# Patient Record
Sex: Female | Born: 2000 | Race: Black or African American | Hispanic: No | Marital: Single | Smoking: Never smoker
Health system: Southern US, Community
[De-identification: ages and names within clinical notes are randomized; demographics above are authoritative.]

## PROBLEM LIST (undated history)

## (undated) DIAGNOSIS — E039 Hypothyroidism, unspecified: Secondary | ICD-10-CM

---
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
## 2020-07-04 ENCOUNTER — Ambulatory Visit
Admission: EM | Admit: 2020-07-04 | Discharge: 2020-07-04 | Disposition: A | Discharge status: Home / Self Care | Payer: Medicaid Other | Attending: Nurse Practitioner | Admitting: Nurse Practitioner

## 2020-07-04 ENCOUNTER — Other Ambulatory Visit: Payer: Self-pay

## 2020-07-04 DIAGNOSIS — N760 Acute vaginitis: Secondary | ICD-10-CM | POA: Diagnosis present

## 2020-07-04 DIAGNOSIS — B9689 Other specified bacterial agents as the cause of diseases classified elsewhere: Secondary | ICD-10-CM

## 2020-07-04 DIAGNOSIS — Z3490 Encounter for supervision of normal pregnancy, unspecified, unspecified trimester: Secondary | ICD-10-CM | POA: Diagnosis not present

## 2020-07-04 LAB — URINALYSIS, COMPLETE (UACMP) WITH MICROSCOPIC
Bilirubin Urine: NEGATIVE
Glucose, UA: NEGATIVE mg/dL
Hgb urine dipstick: NEGATIVE
Leukocytes,Ua: NEGATIVE
Nitrite: NEGATIVE
Protein, ur: NEGATIVE mg/dL
RBC / HPF: NONE SEEN RBC/hpf (ref 0–5)
Specific Gravity, Urine: 1.02 (ref 1.005–1.030)
WBC, UA: NONE SEEN WBC/hpf (ref 0–5)
pH: 6 (ref 5.0–8.0)

## 2020-07-04 LAB — HCG, QUANTITATIVE, PREGNANCY: hCG, Beta Chain, Quant, S: 32411 m[IU]/mL — ABNORMAL HIGH (ref <5)

## 2020-07-04 LAB — PREGNANCY, URINE: Preg Test, Ur: POSITIVE — AB

## 2020-07-04 LAB — CHLAMYDIA/NGC RT PCR (ARMC ONLY)
Chlamydia Tr: DETECTED — AB
N gonorrhoeae: NOT DETECTED

## 2020-07-04 LAB — WET PREP, GENITAL
Sperm: NONE SEEN
Trich, Wet Prep: NONE SEEN
WBC, Wet Prep HPF POC: NONE SEEN
Yeast Wet Prep HPF POC: NONE SEEN

## 2020-07-04 MED ORDER — PROMETHAZINE HCL 12.5 MG PO TABS
12.5000 mg | ORAL_TABLET | Freq: Four times a day (QID) | ORAL | 0 refills | Status: DC | PRN
Start: 2020-07-04 — End: 2020-10-12

## 2020-07-04 MED ORDER — PRENATAL COMPLETE 14-0.4 MG PO TABS: 1.0000 | ORAL_TABLET | Freq: Every day | ORAL | 0 refills | Status: AC

## 2020-07-04 MED ORDER — METRONIDAZOLE 500 MG PO TABS
500.0000 mg | ORAL_TABLET | Freq: Two times a day (BID) | ORAL | 0 refills | Status: DC
Start: 2020-07-04 — End: 2020-10-12

## 2020-07-04 NOTE — ED Triage Notes (Addendum)
Patient complains of breast tenderness, nausea upon awakening, vomiting x 2 weeks. Patient states that she has taken 2 pregnancy tests and they both were positive today.   Patient also states that she is having a milky white discharge for a while and would like to be tested.   States that she did have her first Moderna Covid Vaccine yesterday.

## 2020-07-04 NOTE — ED Provider Notes (Signed)
[facility]    CSN: [id number] Arrival date & time: 07/04/20  1443      History   Chief Complaint Chief Complaint  Patient presents with  . Vaginal Discharge    HPI Jillian Tucker is a 19 y.o. female.   Subjective:  Jillian Tucker is a 19 y.o. female (G0P0) who presents for evaluation of amenorrhea. She believes she could be pregnant. Patient is ambivalent about pregnancy. She is sexually active. She uses Depo-Provera injections for contraceptive. She started on Depo in October 2020. She had her last injection in January 2021. She hasn't had another Depo injection since. LMP unknown. Current symptoms also include: breast tenderness, morning sickness, nausea and two positive home pregnancy test.  Miss Poynor also requests evaluation of vaginal discharge. Symptoms have been present for 1 week. Symptoms include local irritation and white vaginal discharge.   STI Risk: Possible STD exposure   The following portions of the patient's history were reviewed and updated as appropriate: allergies, current medications, past family history, past medical history, past social history, past surgical history and problem list.         History reviewed. No pertinent past medical history.  There are no problems to display for this patient.   History reviewed. No pertinent surgical history.  OB History    Gravida  1   Para      Term      Preterm      AB      Living        SAB      TAB      Ectopic      Multiple      Live Births               Home Medications    Prior to Admission medications   Medication Sig Start Date End Date Taking? Authorizing Provider  metroNIDAZOLE (FLAGYL) 500 MG tablet Take 1 tablet (500 mg total) by mouth 2 (two) times daily. 07/04/20   Lurline Idol, FNP  Prenatal Vit-Fe Fumarate-FA (PRENATAL COMPLETE) 14-0.4 MG TABS Take 1 tablet by mouth daily with breakfast. 07/04/20   Lurline Idol, FNP  promethazine (PHENERGAN) 12.5  MG tablet Take 1 tablet (12.5 mg total) by mouth every 6 (six) hours as needed for nausea or vomiting. 07/04/20 08/03/20  Lurline Idol, FNP    Family History Family History  Problem Relation Age of Onset  . Healthy Mother   . Healthy Father   . Healthy Sister     Social History Social History   Tobacco Use  . Smoking status: Never Smoker  . Smokeless tobacco: Never Used  Vaping Use  . Vaping Use: Never used  Substance Use Topics  . Alcohol use: Never  . Drug use: Never     Allergies   Patient has no known allergies.   Review of Systems Review of Systems  Constitutional: Negative.   Gastrointestinal: Positive for nausea and vomiting.  Genitourinary: Positive for vaginal discharge. Negative for dysuria.  Neurological: Negative.   All other systems reviewed and are negative.    Physical Exam Triage Vital Signs ED Triage Vitals  Enc Vitals Group     BP 07/04/20 1521 119/83     Pulse Rate 07/04/20 1521 85     Resp 07/04/20 1521 18     Temp 07/04/20 1521 98.6 F (37 C)     Temp Source 07/04/20 1521 Oral     SpO2 07/04/20 1521 100 %  Weight 07/04/20 1514 188 lb (85.3 kg)     Height 07/04/20 1514 5\' 6"  (1.676 m)     Head Circumference --      Peak Flow --      Pain Score 07/04/20 1514 0     Pain Loc --      Pain Edu? --      Excl. in GC? --    No data found.  Updated Vital Signs BP 119/83 (BP Location: Left Arm)   Pulse 85   Temp 98.6 F (37 C) (Oral)   Resp 18   Ht 5\' 6"  (1.676 m)   Wt 188 lb (85.3 kg)   LMP  (LMP Unknown) Comment: Depo Shot in 12/2019, cycle in either April or May  SpO2 100%   BMI 30.34 kg/m   Visual Acuity Right Eye Distance:   Left Eye Distance:   Bilateral Distance:    Right Eye Near:   Left Eye Near:    Bilateral Near:     Physical Exam Vitals reviewed.  Constitutional:      Appearance: Normal appearance.  HENT:     Head: Normocephalic.  Cardiovascular:     Rate and Rhythm: Normal rate and regular rhythm.    Pulmonary:     Effort: Pulmonary effort is normal.     Breath sounds: Normal breath sounds.  Abdominal:     Palpations: Abdomen is soft.  Musculoskeletal:        General: Normal range of motion.     Cervical back: Normal range of motion and neck supple.  Skin:    General: Skin is warm and dry.  Neurological:     General: No focal deficit present.     Mental Status: She is alert and oriented to person, place, and time.  Psychiatric:        Mood and Affect: Mood normal.      UC Treatments / Results  Labs (all labs ordered are listed, but only abnormal results are displayed) Labs Reviewed  WET PREP, GENITAL - Abnormal; Notable for the following components:      Result Value   Clue Cells Wet Prep HPF POC PRESENT (*)    All other components within normal limits  PREGNANCY, URINE - Abnormal; Notable for the following components:   Preg Test, Ur POSITIVE (*)    All other components within normal limits  URINALYSIS, COMPLETE (UACMP) WITH MICROSCOPIC - Abnormal; Notable for the following components:   APPearance HAZY (*)    Ketones, ur TRACE (*)    Bacteria, UA FEW (*)    All other components within normal limits  CHLAMYDIA/NGC RT PCR ([facility] ONLY)  HCG, QUANTITATIVE, PREGNANCY    EKG   Radiology No results found.  Procedures Procedures (including critical care time)  Medications Ordered in UC Medications - No data to display  Initial Impression / Assessment and Plan / UC Course  I have reviewed the triage vital signs and the nursing notes.  Pertinent labs & imaging results that were available during my care of the patient were reviewed by me and considered in my medical decision making (see chart for details).    19 yo female (G0P0) with vaginal discharge, breast tenderness, morning sickness, nausea and two positive home pregnancy test. UA negative. Wet preg positive for BV. Pregnancy Test: Positive: EDC: unknown. Briefly discussed pre-natal care options.  Pregnancy, Childbirth and the Newborn book given. Encouraged well-balanced diet, plenty of rest when needed, pre-natal vitamins daily and walking for exercise.  Discussed self-help for nausea, avoiding OTC medications until consulting provider or pharmacist, other than Tylenol as needed, minimal caffeine (1-2 cups daily) and avoiding alcohol. She will schedule her initial OB visit in the next month with an OB provider.   Today's evaluation has revealed no signs of a dangerous process. Discussed diagnosis with patient and/or guardian. Patient and/or guardian aware of their diagnosis, possible red flag symptoms to watch out for and need for close follow up. Patient and/or guardian understands verbal and written discharge instructions. Patient and/or guardian comfortable with plan and disposition.  Patient and/or guardian has a clear mental status at this time, good insight into illness (after discussion and teaching) and has clear judgment to make decisions regarding their care  This care was provided during an unprecedented National Emergency due to the Novel Coronavirus (COVID-19) pandemic. COVID-19 infections and transmission risks place heavy strains on healthcare resources.  As this pandemic evolves, our facility, providers, and staff strive to respond fluidly, to remain operational, and to provide care relative to available resources and information. Outcomes are unpredictable and treatments are without well-defined guidelines. Further, the impact of COVID-19 on all aspects of urgent care, including the impact to patients seeking care for reasons other than COVID-19, is unavoidable during this national emergency. At this time of the global pandemic, management of patients has significantly changed, even for non-COVID positive patients given high local and regional COVID volumes at this time requiring high healthcare system and resource utilization. The standard of care for management of both COVID suspected  and non-COVID suspected patients continues to change rapidly at the local, regional, national, and global levels. This patient was worked up and treated to the best available but ever changing evidence and resources available at this current time.   Documentation was completed with the aid of voice recognition software. Transcription may contain typographical errors.  Final Clinical Impressions(s) / UC Diagnoses   Final diagnoses:  BV (bacterial vaginosis)  Pregnancy, unspecified gestational age     Discharge Instructions     You will need to follow-up with an OB-GYN within the next month. I provided you with two different offices that offer good prenatal care. Call as soon as possible to make an appointment.     ED Prescriptions    Medication Sig Dispense Auth. Provider   metroNIDAZOLE (FLAGYL) 500 MG tablet Take 1 tablet (500 mg total) by mouth 2 (two) times daily. 14 tablet Lurline Idol, FNP   promethazine (PHENERGAN) 12.5 MG tablet Take 1 tablet (12.5 mg total) by mouth every 6 (six) hours as needed for nausea or vomiting. 30 tablet Lurline Idol, FNP   Prenatal Vit-Fe Fumarate-FA (PRENATAL COMPLETE) 14-0.4 MG TABS Take 1 tablet by mouth daily with breakfast. 60 tablet Lurline Idol, FNP     PDMP not reviewed this encounter.   Lurline Idol, [place] 07/04/20 1606

## 2020-07-04 NOTE — Discharge Instructions (Signed)
You will need to follow-up with an OB-GYN within the next month. I provided you with two different offices that offer good prenatal care. Call as soon as possible to make an appointment.

## 2020-07-06 ENCOUNTER — Telehealth (HOSPITAL_COMMUNITY): Payer: Self-pay

## 2020-07-06 MED ORDER — AZITHROMYCIN 250 MG PO TABS: ORAL_TABLET | ORAL | 0 refills | Status: DC

## 2020-10-12 ENCOUNTER — Encounter (HOSPITAL_COMMUNITY): Payer: Self-pay | Admitting: Obstetrics and Gynecology

## 2020-10-12 ENCOUNTER — Inpatient Hospital Stay (HOSPITAL_BASED_OUTPATIENT_CLINIC_OR_DEPARTMENT_OTHER)
Admit: 2020-10-12 | Discharge: 2020-10-12 | Disposition: A | Discharge status: Home / Self Care | Payer: Medicaid Other | Attending: Student | Admitting: Student

## 2020-10-12 ENCOUNTER — Other Ambulatory Visit: Payer: Self-pay

## 2020-10-12 ENCOUNTER — Inpatient Hospital Stay (HOSPITAL_COMMUNITY)
Admission: AD | Admit: 2020-10-12 | Discharge: 2020-10-12 | Disposition: A | Discharge status: Home / Self Care | Payer: Medicaid Other | Attending: Obstetrics and Gynecology | Admitting: Obstetrics and Gynecology

## 2020-10-12 DIAGNOSIS — M7989 Other specified soft tissue disorders: Secondary | ICD-10-CM | POA: Insufficient documentation

## 2020-10-12 DIAGNOSIS — Z3A20 20 weeks gestation of pregnancy: Secondary | ICD-10-CM | POA: Diagnosis not present

## 2020-10-12 DIAGNOSIS — O1202 Gestational edema, second trimester: Secondary | ICD-10-CM | POA: Insufficient documentation

## 2020-10-12 DIAGNOSIS — O26892 Other specified pregnancy related conditions, second trimester: Secondary | ICD-10-CM | POA: Diagnosis not present

## 2020-10-12 HISTORY — DX: Hypothyroidism, unspecified: E03.9

## 2020-10-12 NOTE — MAU Note (Signed)
.   Jillian Tucker is a 19 y.o. at [redacted]w[redacted]d here in MAU reporting: she has had swelling in her left leg and foot for three days. She gets her [facility] in [place] and called them this morning and the CNM told her to get compression socks. She decided to come here and be evaluated. Denies any VB or abdominal pain  Onset of complaint:  3 days Pain score: 8 Vitals:   10/12/20 1359  BP: 131/70  Pulse: 85  Resp: 16  Temp: 98.2 F (36.8 C)  SpO2: 100%     FHT:156 Lab orders placed from triage: UA

## 2020-10-12 NOTE — Discharge Instructions (Signed)

## 2020-10-12 NOTE — MAU Provider Note (Signed)
History     CSN: [id number]  Arrival date and time: 10/12/20 1331   First Provider Initiated Contact with Patient 10/12/20 1427      Chief Complaint  Patient presents with  . left leg swelling   Jillian Tucker is a 19 y.o. G1P0000 at [redacted]w[redacted]d who presents with left leg/foot swelling. Symptoms started earlier this week. Reports increased swelling primarily in her left ankle with associated tenderness. Denies prolonged travel, cough, or SOB. No hx VTE. No injury.  Goes to [facility] for prenatal care.  Denies any other OB complaints.    OB History    Gravida  1   Para  0   Term  0   Preterm  0   AB  0   Living  0     SAB  0   TAB  0   Ectopic  0   Multiple  0   Live Births  0           Past Medical History:  Diagnosis Date  . Hypothyroidism    low, but stable. no meds    History reviewed. No pertinent surgical history.  Family History  Problem Relation Age of Onset  . Healthy Mother   . Healthy Father   . Healthy Sister     Social History   Tobacco Use  . Smoking status: Never Smoker  . Smokeless tobacco: Never Used  Vaping Use  . Vaping Use: Never used  Substance Use Topics  . Alcohol use: Never  . Drug use: Not Currently    Types: Marijuana    Comment: last used May 2021    Allergies: No Known Allergies  No medications prior to admission.    Review of Systems  Constitutional: Negative.   Respiratory: Negative for cough and shortness of breath.   Cardiovascular: Positive for leg swelling. Negative for chest pain.  Gastrointestinal: Negative.    Physical Exam   Blood pressure 119/68, pulse 82, temperature 98.2 F (36.8 C), resp. rate 16, height 5\' 3"  (1.6 m), weight 82.6 kg, SpO2 100 %.  Physical Exam Vitals and nursing note reviewed.  Constitutional:      General: She is not in acute distress.    Appearance: Normal appearance.  HENT:     Head: Normocephalic and atraumatic.  Cardiovascular:     Pulses:          Dorsalis pedis  pulses are 2+ on the right side and 2+ on the left side.     Comments: Left & right calf equal at 37 cm Pulmonary:     Effort: Pulmonary effort is normal. No respiratory distress.  Musculoskeletal:     Right lower leg: No edema.     Left lower leg: 1+ Edema (non pitting) present.  Neurological:     Mental Status: She is alert.  Psychiatric:        Mood and Affect: Mood normal.        Behavior: Behavior normal.     MAU Course  Procedures No results found for this or any previous visit (from the past 24 hour(s)).  MDM FHT present via doppler  Calves equal bilaterally. Mild swelling of left ankle & foot. Venous doppler study ordered and no evidence of DVT.   Assessment and Plan   1. Swelling of lower extremity during pregnancy in second trimester  -No evidence of DVT. Reviewed reasons to return to MAU -knee high compression socks, elevation, no prolonged positions  2. [redacted] weeks gestation of  pregnancy      Judeth Horn 10/12/2020, 3:56 PM

## 2020-10-12 NOTE — Progress Notes (Signed)
Left lower extremity venous duplex has been completed. Preliminary results can be found in CV Proc through chart review.  Results were given to Judeth Horn NP.  10/12/20 3:10 PM Olen Cordial RVT

## 2021-10-10 ENCOUNTER — Encounter: Payer: Self-pay | Admitting: Emergency Medicine

## 2021-10-10 ENCOUNTER — Ambulatory Visit
Admission: EM | Admit: 2021-10-10 | Discharge: 2021-10-10 | Disposition: A | Discharge status: Home / Self Care | Payer: Medicaid Other | Attending: Nurse Practitioner | Admitting: Nurse Practitioner

## 2021-10-10 ENCOUNTER — Other Ambulatory Visit: Payer: Self-pay

## 2021-10-10 DIAGNOSIS — R110 Nausea: Secondary | ICD-10-CM

## 2021-10-10 DIAGNOSIS — R350 Frequency of micturition: Secondary | ICD-10-CM | POA: Diagnosis present

## 2021-10-10 DIAGNOSIS — B9689 Other specified bacterial agents as the cause of diseases classified elsewhere: Secondary | ICD-10-CM | POA: Diagnosis present

## 2021-10-10 DIAGNOSIS — Z3202 Encounter for pregnancy test, result negative: Secondary | ICD-10-CM | POA: Diagnosis present

## 2021-10-10 DIAGNOSIS — N760 Acute vaginitis: Secondary | ICD-10-CM

## 2021-10-10 LAB — URINALYSIS, COMPLETE (UACMP) WITH MICROSCOPIC
Glucose, UA: NEGATIVE mg/dL
Hgb urine dipstick: NEGATIVE
Ketones, ur: 5 mg/dL — AB
Leukocytes,Ua: NEGATIVE
Nitrite: NEGATIVE
Protein, ur: 30 mg/dL — AB
Specific Gravity, Urine: 1.025 (ref 1.005–1.030)
Squamous Epithelial / HPF: >50 (ref 0–5)
pH: 6.5 (ref 5.0–8.0)

## 2021-10-10 LAB — WET PREP, GENITAL
Sperm: NONE SEEN
Trich, Wet Prep: NONE SEEN
Yeast Wet Prep HPF POC: NONE SEEN

## 2021-10-10 LAB — PREGNANCY, URINE: Preg Test, Ur: NEGATIVE

## 2021-10-10 MED ORDER — ONDANSETRON 8 MG PO TBDP: 8.0000 mg | ORAL_TABLET | Freq: Three times a day (TID) | ORAL | 0 refills | Status: AC | PRN

## 2021-10-10 MED ORDER — METRONIDAZOLE 500 MG PO TABS
500.0000 mg | ORAL_TABLET | Freq: Two times a day (BID) | ORAL | 0 refills | Status: DC
Start: 2021-10-10 — End: 2023-06-05

## 2021-10-10 NOTE — ED Provider Notes (Signed)
[facility]    CSN: [id number] Arrival date & time: 10/10/21  1416      History   Chief Complaint Chief Complaint  Patient presents with   Nausea    HPI Jillian Tucker is a 20 y.o. female.   Subjective:  Jillian Tucker is a 20 y.o. female who presents for evaluation of abnormal menstrual cycle and possibility of pregnancy. She is sexually active with a single female partner. She is not on birth control. She also endorses frequent urination, nausea, abdominal cramping, vomiting, vaginal discharge, low back pain and headaches. The vaginal discharge is white and of milky consistency. No odor. She denies any dysuria, vaginal itching, vaginal irritation or flank pain. Last period was abnormal last month. Patent is unsure when exactly her last menstrual period was. She haven't tried anything for her symptoms. She had a negative COVID test several days ago.    The following portions of the patient's history were reviewed and updated as appropriate: allergies, current medications, past family history, past medical history, past social history, past surgical history, and problem list.       Past Medical History:  Diagnosis Date   Hypothyroidism    low, but stable. no meds    There are no problems to display for this patient.   History reviewed. No pertinent surgical history.  OB History     Gravida  1   Para  0   Term  0   Preterm  0   AB  0   Living  0      SAB  0   IAB  0   Ectopic  0   Multiple  0   Live Births  0            Home Medications    Prior to Admission medications   Medication Sig Start Date End Date Taking? Authorizing Provider  metroNIDAZOLE (FLAGYL) 500 MG tablet Take 1 tablet (500 mg total) by mouth 2 (two) times daily. 10/10/21  Yes Lurline Idol, FNP  ondansetron (ZOFRAN-ODT) 8 MG disintegrating tablet Take 1 tablet (8 mg total) by mouth every 8 (eight) hours as needed for nausea or vomiting. 10/10/21  Yes Lurline Idol, FNP  Prenatal Vit-Fe Fumarate-FA (PRENATAL COMPLETE) 14-0.4 MG TABS Take 1 tablet by mouth daily with breakfast. 07/04/20   Lurline Idol, FNP    Family History Family History  Problem Relation Age of Onset   Healthy Mother    Healthy Father    Healthy Sister     Social History Social History   Tobacco Use   Smoking status: Never   Smokeless tobacco: Never  Vaping Use   Vaping Use: Never used  Substance Use Topics   Alcohol use: Never   Drug use: Not Currently    Types: Marijuana    Comment: last used May 2021     Allergies   Patient has no known allergies.   Review of Systems Review of Systems  Constitutional:  Negative for fever.  Gastrointestinal:  Positive for abdominal pain, nausea and vomiting.  Genitourinary:  Positive for frequency and vaginal discharge. Negative for dysuria, flank pain and genital sores.  Musculoskeletal:  Positive for back pain.  Neurological:  Positive for headaches.  All other systems reviewed and are negative.   Physical Exam Triage Vital Signs ED Triage Vitals  Enc Vitals Group     BP      Pulse      Resp  Temp      Temp src      SpO2      Weight      Height      Head Circumference      Peak Flow      Pain Score      Pain Loc      Pain Edu?      Excl. in GC?    No data found.  Updated Vital Signs BP (!) 116/92 (BP Location: Left Arm)   Pulse 81   Temp 98.5 F (36.9 C) (Oral)   Resp 18   Ht 5\' 3"  (1.6 m)   Wt 182 lb 1.6 oz (82.6 kg)   LMP  (LMP Unknown)   SpO2 98%   BMI 32.26 kg/m   Visual Acuity Right Eye Distance:   Left Eye Distance:   Bilateral Distance:    Right Eye Near:   Left Eye Near:    Bilateral Near:     Physical Exam Vitals reviewed.  Constitutional:      General: She is not in acute distress.    Appearance: Normal appearance. She is not ill-appearing, toxic-appearing or diaphoretic.  HENT:     Head: Normocephalic.  Cardiovascular:     Rate and Rhythm: Normal rate.   Pulmonary:     Effort: Pulmonary effort is normal.  Genitourinary:    Comments: Vaginal exam deferred. Patient performed self swab for testing  Musculoskeletal:        General: Normal range of motion.     Cervical back: Normal range of motion and neck supple.  Skin:    General: Skin is warm and dry.  Neurological:     General: No focal deficit present.     Mental Status: She is alert and oriented to person, place, and time.  Psychiatric:        Mood and Affect: Mood normal.     UC Treatments / Results  Labs (all labs ordered are listed, but only abnormal results are displayed) Labs Reviewed  WET PREP, GENITAL - Abnormal; Notable for the following components:      Result Value   Clue Cells Wet Prep HPF POC PRESENT (*)    WBC, Wet Prep HPF POC FEW (*)    All other components within normal limits  URINALYSIS, COMPLETE (UACMP) WITH MICROSCOPIC - Abnormal; Notable for the following components:   Bilirubin Urine SMALL (*)    Ketones, ur 5 (*)    Protein, ur 30 (*)    Bacteria, UA MANY (*)    All other components within normal limits  CHLAMYDIA/NGC RT PCR ([facility] ONLY)            URINE CULTURE  PREGNANCY, URINE  HCG, QUANTITATIVE, PREGNANCY    EKG   Radiology No results found.  Procedures Procedures (including critical care time)  Medications Ordered in UC Medications - No data to display  Initial Impression / Assessment and Plan / UC Course  I have reviewed the triage vital signs and the nursing notes.  Pertinent labs & imaging results that were available during my care of the patient were reviewed by me and considered in my medical decision making (see chart for details).    20 yo female presenting with irregular menses.  She is concerned about possible pregnancy.  She also has frequent urination, nausea, abdominal cramping, vomiting, vaginal discharge, low back pain and headaches.  She had a cycle sometime last month but is unsure when exactly her last menstrual  period was.  She is sexually active and not on any birth control.  Home pregnancy test was negative.  Urinalysis negative for anything acute.  Wet prep shows clue cells which is indicative of bacterial vaginosis.  Flagyl ordered.  Patient instructed to abstain from sexual activity until symptoms resolve. Pregnancy Test was negative: Patient reassured. Contraceptive counseling done. Patient has no planned contraceptive method. If menses has not started in two weeks return to clinic for another pregnancy test.   Today's evaluation has revealed no signs of a dangerous process. Discussed diagnosis with patient and/or guardian. Patient and/or guardian aware of their diagnosis, possible red flag symptoms to watch out for and need for close follow up. Patient and/or guardian understands verbal and written discharge instructions. Patient and/or guardian comfortable with plan and disposition.  Patient and/or guardian has a clear mental status at this time, good insight into illness (after discussion and teaching) and has clear judgment to make decisions regarding their care  This care was provided during an unprecedented National Emergency due to the Novel Coronavirus (COVID-19) pandemic. COVID-19 infections and transmission risks place heavy strains on healthcare resources.  As this pandemic evolves, our facility, providers, and staff strive to respond fluidly, to remain operational, and to provide care relative to available resources and information. Outcomes are unpredictable and treatments are without well-defined guidelines. Further, the impact of COVID-19 on all aspects of urgent care, including the impact to patients seeking care for reasons other than COVID-19, is unavoidable during this national emergency. At this time of the global pandemic, management of patients has significantly changed, even for non-COVID positive patients given high local and regional COVID volumes at this time requiring high healthcare  system and resource utilization. The standard of care for management of both COVID suspected and non-COVID suspected patients continues to change rapidly at the local, regional, national, and global levels. This patient was worked up and treated to the best available but ever changing evidence and resources available at this current time.   Documentation was completed with the aid of voice recognition software. Transcription may contain typographical errors.  Final Clinical Impressions(s) / UC Diagnoses   Final diagnoses:  Nausea without vomiting  Bacterial vaginosis  Encounter for pregnancy test, result negative  Urinary frequency   Discharge Instructions   None    ED Prescriptions     Medication Sig Dispense Auth. Provider   ondansetron (ZOFRAN-ODT) 8 MG disintegrating tablet Take 1 tablet (8 mg total) by mouth every 8 (eight) hours as needed for nausea or vomiting. 20 tablet Lurline Idol, FNP   metroNIDAZOLE (FLAGYL) 500 MG tablet Take 1 tablet (500 mg total) by mouth 2 (two) times daily. 14 tablet Lurline Idol, FNP      PDMP not reviewed this encounter.   Lurline Idol, [place] 10/10/21 [phone]

## 2021-10-10 NOTE — Discharge Instructions (Addendum)
Take medications as prescribed  Do not drink alcohol while on the antibiotics  Keep your genital area clean and dry.  Avoid soap, lotions and sprays with fragrances to genital area Do not douche or use tampons Do not have sex until your test results are back and you no longer have any symptoms  Wipe from front to back after the urination to avoid the spread of bacteria from the rectum to the vagina.

## 2021-10-10 NOTE — ED Triage Notes (Signed)
Pt c/o fatigue, headache, nausea/vomiting, lower abdominal cramping. Started about 2 weeks ago. She states she does not know when her last period was. She has been doing home pregnancy test and has been negative. She also states she has had a lot of vaginal discharge. She states the discharge is white and milky.

## 2021-10-12 LAB — URINE CULTURE

## 2022-01-07 ENCOUNTER — Encounter: Payer: Self-pay | Admitting: Emergency Medicine

## 2022-01-07 ENCOUNTER — Ambulatory Visit
Admission: EM | Admit: 2022-01-07 | Discharge: 2022-01-07 | Disposition: A | Discharge status: Home / Self Care | Payer: Medicaid Other | Attending: Emergency Medicine | Admitting: Emergency Medicine

## 2022-01-07 ENCOUNTER — Other Ambulatory Visit: Payer: Self-pay

## 2022-01-07 DIAGNOSIS — Z3201 Encounter for pregnancy test, result positive: Secondary | ICD-10-CM | POA: Insufficient documentation

## 2022-01-07 LAB — PREGNANCY, URINE: Preg Test, Ur: POSITIVE — AB

## 2022-01-07 MED ORDER — DOXYLAMINE-PYRIDOXINE 10-10 MG PO TBEC: 2.0000 | DELAYED_RELEASE_TABLET | Freq: Every evening | ORAL | 2 refills | Status: AC

## 2022-01-07 NOTE — ED Provider Notes (Signed)
[facility]    CSN: [id number] Arrival date & time: 01/07/22  1314      History   Chief Complaint Chief Complaint  Patient presents with   Possible Pregnancy    HPI Jillian Tucker is a 21 y.o. female.   HPI  21 year old female here for confirmatory pregnancy test.  Patient reports that her last normal menstrual period was 11/01/2021.  She has been experiencing some lower abdominal/suprapubic cramping, tenderness to her left breast, nausea, and vomiting for the last week or so.  She took a pregnancy test at home today that was positive.  She is here for confirmatory test.  Past Medical History:  Diagnosis Date   Hypothyroidism    low, but stable. no meds    There are no problems to display for this patient.   History reviewed. No pertinent surgical history.  OB History     Gravida  1   Para  0   Term  0   Preterm  0   AB  0   Living  0      SAB  0   IAB  0   Ectopic  0   Multiple  0   Live Births  0            Home Medications    Prior to Admission medications   Medication Sig Start Date End Date Taking? Authorizing Provider  Doxylamine-Pyridoxine 10-10 MG TBEC Take 2 tablets by mouth at bedtime. Take 2 tablets at bedtime on days 1 and 2.  If nausea persists take 1 tablet in the morning on day 3 and 2 tablets at bedtime.  If symptoms still continue you may take 1 tablet in the morning, 1 tablet mid afternoon, and 2 tablets at bedtime on day 4. 01/07/22  Yes Becky Augusta, NP  metroNIDAZOLE (FLAGYL) 500 MG tablet Take 1 tablet (500 mg total) by mouth 2 (two) times daily. 10/10/21   Lurline Idol, FNP  ondansetron (ZOFRAN-ODT) 8 MG disintegrating tablet Take 1 tablet (8 mg total) by mouth every 8 (eight) hours as needed for nausea or vomiting. 10/10/21   Lurline Idol, FNP  Prenatal Vit-Fe Fumarate-FA (PRENATAL COMPLETE) 14-0.4 MG TABS Take 1 tablet by mouth daily with breakfast. 07/04/20   Lurline Idol, FNP    Family  History Family History  Problem Relation Age of Onset   Healthy Mother    Healthy Father    Healthy Sister     Social History Social History   Tobacco Use   Smoking status: Never   Smokeless tobacco: Never  Vaping Use   Vaping Use: Never used  Substance Use Topics   Alcohol use: Never   Drug use: Not Currently    Types: Marijuana    Comment: last used May 2021     Allergies   Patient has no known allergies.   Review of Systems Review of Systems  Gastrointestinal:  Positive for abdominal pain, nausea and vomiting.    Physical Exam Triage Vital Signs ED Triage Vitals  Enc Vitals Group     BP 01/07/22 1410 124/66     Pulse Rate 01/07/22 1410 75     Resp 01/07/22 1410 15     Temp 01/07/22 1410 98 F (36.7 C)     Temp Source 01/07/22 1410 Oral     SpO2 01/07/22 1410 99 %     Weight --      Height --      Head Circumference --  Peak Flow --      Pain Score 01/07/22 1408 0     Pain Loc --      Pain Edu? --      Excl. in GC? --    No data found.  Updated Vital Signs BP 124/66 (BP Location: Left Arm)    Pulse 75    Temp 98 F (36.7 C) (Oral)    Resp 15    LMP 11/01/2021 (Approximate)    SpO2 99%   Visual Acuity Right Eye Distance:   Left Eye Distance:   Bilateral Distance:    Right Eye Near:   Left Eye Near:    Bilateral Near:     Physical Exam Vitals and nursing note reviewed.  Constitutional:      General: She is not in acute distress.    Appearance: Normal appearance. She is not ill-appearing.  HENT:     Head: Normocephalic and atraumatic.  Skin:    General: Skin is warm and dry.     Capillary Refill: Capillary refill takes less than 2 seconds.     Findings: No erythema or rash.  Neurological:     General: No focal deficit present.     Mental Status: She is alert and oriented to person, place, and time.  Psychiatric:        Mood and Affect: Mood normal.        Behavior: Behavior normal.        Thought Content: Thought content normal.         Judgment: Judgment normal.     UC Treatments / Results  Labs (all labs ordered are listed, but only abnormal results are displayed) Labs Reviewed  PREGNANCY, URINE - Abnormal; Notable for the following components:      Result Value   Preg Test, Ur POSITIVE (*)    All other components within normal limits    EKG   Radiology No results found.  Procedures Procedures (including critical care time)  Medications Ordered in UC Medications - No data to display  Initial Impression / Assessment and Plan / UC Course  I have reviewed the triage vital signs and the nursing notes.  Pertinent labs & imaging results that were available during my care of the patient were reviewed by me and considered in my medical decision making (see chart for details).  Is a nontoxic-appearing 21 year old female here for confirmatory pregnancy test after testing positive today.  Her last menstrual period was 10-30-21.  She has had some lower abdominal cramping, left breast tenderness, nausea, and vomiting.  She denies abdominal pain or vaginal bleeding.  She took a pregnancy test at home and it was positive today and she is here for confirmatory test.  Urinalysis was collected and sent to lab.  Urine pregnancy test is positive.  Will discharge patient home with diagnosis of positive pregnancy and have her follow-up with her obstetrician.   Final Clinical Impressions(s) / UC Diagnoses   Final diagnoses:  Positive pregnancy test     Discharge Instructions      Pregnancy test was positive today in the urgent care.  Please follow-up with your obstetrician going forward.  For your nausea and vomiting as it is most likely related to the pregnancy I am going to prescribe you Diclegis. Please take as directed below.  Doxylamine 10 mg/pyridoxine 10 mg (Diclegis): Oral: Initial: Two tablets at bedtime on day 1 and 2; if symptoms persist, take 1 tablet in morning and 2 tablets at bedtime  on day 3; if  symptoms persist, may increase to 1 tablet in morning, 1 tablet mid-afternoon, and 2 tablets at bedtime on day 4 (maximum: doxylamine 40 mg/pyridoxine 40 mg (4 tablets) per day)       ED Prescriptions     Medication Sig Dispense Auth. Provider   Doxylamine-Pyridoxine 10-10 MG TBEC Take 2 tablets by mouth at bedtime. Take 2 tablets at bedtime on days 1 and 2.  If nausea persists take 1 tablet in the morning on day 3 and 2 tablets at bedtime.  If symptoms still continue you may take 1 tablet in the morning, 1 tablet mid afternoon, and 2 tablets at bedtime on day 4. 60 tablet Becky Augustayan, Kamsiyochukwu Buist, NP      PDMP not reviewed this encounter.   Becky Augustayan, Nysa Sarin, NP 01/07/22 1553

## 2022-01-07 NOTE — Discharge Instructions (Addendum)
Pregnancy test was positive today in the urgent care.  Please follow-up with your obstetrician going forward.  For your nausea and vomiting as it is most likely related to the pregnancy I am going to prescribe you Diclegis. Please take as directed below.  Doxylamine 10 mg/pyridoxine 10 mg (Diclegis): Oral: Initial: Two tablets at bedtime on day 1 and 2; if symptoms persist, take 1 tablet in morning and 2 tablets at bedtime on day 3; if symptoms persist, may increase to 1 tablet in morning, 1 tablet mid-afternoon, and 2 tablets at bedtime on day 4 (maximum: doxylamine 40 mg/pyridoxine 40 mg (4 tablets) per day)

## 2022-01-07 NOTE — ED Triage Notes (Signed)
Pt reports LAMP was beginning of November. Had positive home pregnancy test today.

## 2022-01-10 ENCOUNTER — Encounter: Payer: Self-pay | Admitting: Emergency Medicine

## 2022-01-10 DIAGNOSIS — Z5321 Procedure and treatment not carried out due to patient leaving prior to being seen by health care provider: Secondary | ICD-10-CM | POA: Diagnosis not present

## 2022-01-10 DIAGNOSIS — O219 Vomiting of pregnancy, unspecified: Secondary | ICD-10-CM | POA: Diagnosis present

## 2022-01-10 LAB — CBC WITH DIFFERENTIAL/PLATELET
Abs Immature Granulocytes: 0.02 10*3/uL (ref 0.00–0.07)
Basophils Absolute: 0 10*3/uL (ref 0.0–0.1)
Basophils Relative: 0 %
Eosinophils Absolute: 0 10*3/uL (ref 0.0–0.5)
Eosinophils Relative: 0 %
HCT: 44.2 % (ref 36.0–46.0)
Hemoglobin: 14.9 g/dL (ref 12.0–15.0)
Immature Granulocytes: 0 %
Lymphocytes Relative: 5 %
Lymphs Abs: 0.5 10*3/uL — ABNORMAL LOW (ref 0.7–4.0)
MCH: 29.6 pg (ref 26.0–34.0)
MCHC: 33.7 g/dL (ref 30.0–36.0)
MCV: 87.7 fL (ref 80.0–100.0)
Monocytes Absolute: 0.5 10*3/uL (ref 0.1–1.0)
Monocytes Relative: 5 %
Neutro Abs: 8.5 10*3/uL — ABNORMAL HIGH (ref 1.7–7.7)
Neutrophils Relative %: 90 %
Platelets: 289 10*3/uL (ref 150–400)
RBC: 5.04 MIL/uL (ref 3.87–5.11)
RDW: 12.5 % (ref 11.5–15.5)
WBC: 9.6 10*3/uL (ref 4.0–10.5)
nRBC: 0 % (ref 0.0–0.2)

## 2022-01-10 LAB — URINALYSIS, ROUTINE W REFLEX MICROSCOPIC
Glucose, UA: NEGATIVE mg/dL
Hgb urine dipstick: NEGATIVE
Ketones, ur: >160 mg/dL — AB
Leukocytes,Ua: NEGATIVE
Nitrite: NEGATIVE
Protein, ur: 30 mg/dL — AB
Specific Gravity, Urine: >1.03 — ABNORMAL HIGH (ref 1.005–1.030)
pH: 5.5 (ref 5.0–8.0)

## 2022-01-10 LAB — COMPREHENSIVE METABOLIC PANEL WITH GFR
ALT: 14 U/L (ref 0–44)
AST: 18 U/L (ref 15–41)
Albumin: 4.8 g/dL (ref 3.5–5.0)
Alkaline Phosphatase: 47 U/L (ref 38–126)
Anion gap: 9 (ref 5–15)
BUN: 8 mg/dL (ref 6–20)
CO2: 25 mmol/L (ref 22–32)
Calcium: 9.6 mg/dL (ref 8.9–10.3)
Chloride: 106 mmol/L (ref 98–111)
Creatinine, Ser: 0.64 mg/dL (ref 0.44–1.00)
GFR, Estimated: >60 mL/min
Glucose, Bld: 102 mg/dL — ABNORMAL HIGH (ref 70–99)
Potassium: 3.8 mmol/L (ref 3.5–5.1)
Sodium: 140 mmol/L (ref 135–145)
Total Bilirubin: 0.8 mg/dL (ref 0.3–1.2)
Total Protein: 8.6 g/dL — ABNORMAL HIGH (ref 6.5–8.1)

## 2022-01-10 LAB — URINALYSIS, MICROSCOPIC (REFLEX): Bacteria, UA: NONE SEEN

## 2022-01-10 LAB — HCG, QUANTITATIVE, PREGNANCY: hCG, Beta Chain, Quant, S: 872 m[IU]/mL — ABNORMAL HIGH (ref <5)

## 2022-01-10 LAB — POC URINE PREG, ED: Preg Test, Ur: POSITIVE — AB

## 2022-01-10 LAB — TROPONIN I (HIGH SENSITIVITY): Troponin I (High Sensitivity): <2 ng/L (ref <18)

## 2022-01-10 NOTE — ED Triage Notes (Signed)
Pt presents via POV with a complaint of emesis for several days. Pt is pregnant but is unsure how far along she is in her pregnancy nor has she received prenatal care. She states having CP earlier today that lasted a few minutes but subsided on its own. Denies SOB

## 2022-01-11 ENCOUNTER — Emergency Department
Admission: EM | Admit: 2022-01-11 | Discharge: 2022-01-11 | Disposition: A | Discharge status: Home / Self Care | Payer: Medicaid Other | Attending: Emergency Medicine | Admitting: Emergency Medicine

## 2022-05-21 ENCOUNTER — Emergency Department
Admission: EM | Admit: 2022-05-21 | Discharge: 2022-05-22 | Disposition: A | Discharge status: Home / Self Care | Payer: Medicaid Other | Attending: Emergency Medicine | Admitting: Emergency Medicine

## 2022-05-21 ENCOUNTER — Emergency Department: Payer: Medicaid Other

## 2022-05-21 ENCOUNTER — Encounter: Payer: Self-pay | Admitting: *Deleted

## 2022-05-21 ENCOUNTER — Other Ambulatory Visit: Payer: Self-pay

## 2022-05-21 DIAGNOSIS — O039 Complete or unspecified spontaneous abortion without complication: Secondary | ICD-10-CM | POA: Insufficient documentation

## 2022-05-21 DIAGNOSIS — O209 Hemorrhage in early pregnancy, unspecified: Secondary | ICD-10-CM | POA: Diagnosis present

## 2022-05-21 LAB — COMPREHENSIVE METABOLIC PANEL
ALT: 11 U/L (ref 0–44)
AST: 16 U/L (ref 15–41)
Albumin: 4.6 g/dL (ref 3.5–5.0)
Alkaline Phosphatase: 36 U/L — ABNORMAL LOW (ref 38–126)
Anion gap: 7 (ref 5–15)
BUN: 8 mg/dL (ref 6–20)
CO2: 25 mmol/L (ref 22–32)
Calcium: 9.1 mg/dL (ref 8.9–10.3)
Chloride: 102 mmol/L (ref 98–111)
Creatinine, Ser: 0.66 mg/dL (ref 0.44–1.00)
GFR, Estimated: >60 mL/min (ref >60)
Glucose, Bld: 99 mg/dL (ref 70–99)
Potassium: 3.6 mmol/L (ref 3.5–5.1)
Sodium: 134 mmol/L — ABNORMAL LOW (ref 135–145)
Total Bilirubin: 0.7 mg/dL (ref 0.3–1.2)
Total Protein: 7.9 g/dL (ref 6.5–8.1)

## 2022-05-21 LAB — URINALYSIS, ROUTINE W REFLEX MICROSCOPIC
Bacteria, UA: NONE SEEN
Specific Gravity, Urine: 1.026 (ref 1.005–1.030)
Squamous Epithelial / HPF: NONE SEEN (ref 0–5)
WBC, UA: NONE SEEN WBC/hpf (ref 0–5)

## 2022-05-21 LAB — CBC
HCT: 39.1 % (ref 36.0–46.0)
Hemoglobin: 12.5 g/dL (ref 12.0–15.0)
MCH: 28.7 pg (ref 26.0–34.0)
MCHC: 32 g/dL (ref 30.0–36.0)
MCV: 89.7 fL (ref 80.0–100.0)
Platelets: 334 10*3/uL (ref 150–400)
RBC: 4.36 MIL/uL (ref 3.87–5.11)
RDW: 12.5 % (ref 11.5–15.5)
WBC: 5.9 10*3/uL (ref 4.0–10.5)
nRBC: 0 % (ref 0.0–0.2)

## 2022-05-21 LAB — HCG, QUANTITATIVE, PREGNANCY: hCG, Beta Chain, Quant, S: 16153 m[IU]/mL — ABNORMAL HIGH (ref <5)

## 2022-05-21 LAB — ABO/RH: ABO/RH(D): A POS

## 2022-05-21 LAB — POC URINE PREG, ED: Preg Test, Ur: POSITIVE — AB

## 2022-05-21 NOTE — ED Triage Notes (Signed)
Pt reports she is [redacted] weeks pregnant.  Pt started having discharge yesterday and vag bleeding today.  Pt has abd cramping.  Pt alert   speech clear.  [id number]

## 2022-05-21 NOTE — ED Provider Notes (Signed)
[facility] Provider Note    Event Date/Time   First MD Initiated Contact with Patient 05/21/22 2105     (approximate)   History   Chief Complaint: Vaginal Bleeding   HPI  Jillian Tucker is a 21 y.o. female G3 P1 currently [redacted] weeks pregnant with 1 prior miscarriage in January of this year who comes ED complaining of vaginal bleeding and pelvic cramping.  She started having light bleeding 2 days ago and went to [facility] ED.  Labs were done at that time, but due to prolonged wait time she left without being seen.  She is continued having vaginal bleeding which has accelerated today and is now brisk.  She is passing both fresh red blood and clots.  She has increasing cramping pain as well.  No fever or chills or vomiting.  No chest pain or shortness of breath. she has not had an ultrasound during this pregnancy so far, and first prenatal appointment with [facility] is upcoming..  Reviewed patient's [facility] test results from MyChart which show: 05/19/2022 hemoglobin 12.2 05/19/2022 beta-hCG serum 17,000 January 2023 blood type A+, antibody negative     Physical Exam   Triage Vital Signs: ED Triage Vitals  Enc Vitals Group     BP 05/21/22 2040 (!) 146/77     Pulse Rate 05/21/22 2040 100     Resp 05/21/22 2040 16     Temp 05/21/22 2040 98.1 F (36.7 C)     Temp Source 05/21/22 2040 Oral     SpO2 05/21/22 2040 100 %     Weight 05/21/22 2040 180 lb (81.6 kg)     Height 05/21/22 2040 5\' 4"  (1.626 m)     Head Circumference --      Peak Flow --      Pain Score 05/21/22 2040 8     Pain Loc --      Pain Edu? --      Excl. in [place]? --     Most recent vital signs: Vitals:   05/21/22 2301 05/21/22 2302  BP: 121/68 121/68  Pulse:  78  Resp:  15  Temp:    SpO2:  99%    General: Awake, no distress.  CV:  Good peripheral perfusion.  Resp:  Normal effort.  Abd:  No distention.  Soft and nontender, mild suprapubic discomfort. Other:  No rash, moist oral  mucosa   ED Results / Procedures / Treatments   Labs (all labs ordered are listed, but only abnormal results are displayed) Labs Reviewed  COMPREHENSIVE METABOLIC PANEL - Abnormal; Notable for the following components:      Result Value   Sodium 134 (*)    Alkaline Phosphatase 36 (*)    All other components within normal limits  URINALYSIS, ROUTINE W REFLEX MICROSCOPIC - Abnormal; Notable for the following components:   Color, Urine RED (*)    APPearance TURBID (*)    Glucose, UA   (*)    Value: TEST NOT REPORTED DUE TO COLOR INTERFERENCE OF URINE PIGMENT   Hgb urine dipstick   (*)    Value: TEST NOT REPORTED DUE TO COLOR INTERFERENCE OF URINE PIGMENT   Bilirubin Urine   (*)    Value: TEST NOT REPORTED DUE TO COLOR INTERFERENCE OF URINE PIGMENT   Ketones, ur   (*)    Value: TEST NOT REPORTED DUE TO COLOR INTERFERENCE OF URINE PIGMENT   Protein, ur   (*)    Value: TEST NOT  REPORTED DUE TO COLOR INTERFERENCE OF URINE PIGMENT   Nitrite   (*)    Value: TEST NOT REPORTED DUE TO COLOR INTERFERENCE OF URINE PIGMENT   Leukocytes,Ua   (*)    Value: TEST NOT REPORTED DUE TO COLOR INTERFERENCE OF URINE PIGMENT   All other components within normal limits  HCG, QUANTITATIVE, PREGNANCY - Abnormal; Notable for the following components:   hCG, Beta Chain, Quant, S 16,153 (*)    All other components within normal limits  POC URINE PREG, ED - Abnormal; Notable for the following components:   Preg Test, Ur POSITIVE (*)    All other components within normal limits  CBC  ABO/RH     EKG    RADIOLOGY Ultrasound pelvis viewed and interpreted by me, shows gestational sac in the lower uterine segment, no adnexal mass.  Fetal cardiac activity.  Radiology report reviewed confirming IUP, suspected blighted ovum   PROCEDURES:  Procedures   MEDICATIONS ORDERED IN ED: Medications - No data to display   IMPRESSION / MDM / [place] / ED COURSE  I reviewed the triage vital signs  and the nursing notes.                              Differential diagnosis includes, but is not limited to, spontaneous abortion, anemia, ectopic pregnancy, subchorionic hemorrhage  Patient's presentation is most consistent with acute presentation with potential threat to life or bodily function.  Patient presents with heavy vaginal bleeding in the setting of first trimester pregnancy at about [redacted] weeks gestation.  Vital signs are normal, abdomen is nonperitoneal.  Will obtain pelvic ultrasound and check beta hCG for trending.  Blood type is known to be a positive.  ----------------------------------------- 11:50 PM on 05/21/2022 ----------------------------------------- Beta-hCG downtrending, 16,100 today.  Hemoglobin stable.  Pelvic exam performed which revealed a large amount of blood in the vaginal vault.  Using gauze and ring forceps, blood and clots were evacuated until the cervical os was visualized which showed only slight active oozing.  No brisk hemorrhage.  Stable for continued supportive care at home.  She has an appointment to follow-up with [facility] tomorrow which she will keep.       FINAL CLINICAL IMPRESSION(S) / ED DIAGNOSES   Final diagnoses:  Spontaneous abortion in first trimester     Rx / DC Orders   ED Discharge Orders     None        Note:  This document was prepared using Dragon voice recognition software and may include unintentional dictation errors.   Carrie Mew, MD 05/21/22 2351

## 2022-05-22 LAB — SAMPLE TO BLOOD BANK

## 2022-09-19 IMAGING — US US OB < 14 WEEKS - US OB TV
1 series · 13 of 28 positions shown · non-contrast
Comparison: None Available.

CLINICAL DATA: Pelvic cramping and vaginal bleeding for 3 days.
Estimated gestational age by LMP is 11 weeks 2 days. Positive urine
pregnancy test. Quantitative beta HCG was 17,000 on 05/19/2022.

EXAM:
OBSTETRIC <14 WK US AND TRANSVAGINAL OB US
TECHNIQUE: Both transabdominal and transvaginal ultrasound examinations were
performed for complete evaluation of the gestation as well as the
maternal uterus, adnexal regions, and pelvic cul-de-sac.
Transvaginal technique was performed to assess early pregnancy.

[Series 1: us ob less than 14 weeks with ob transvaginal · 101 acquisitions, 13 frames shown]
[im 4/101]
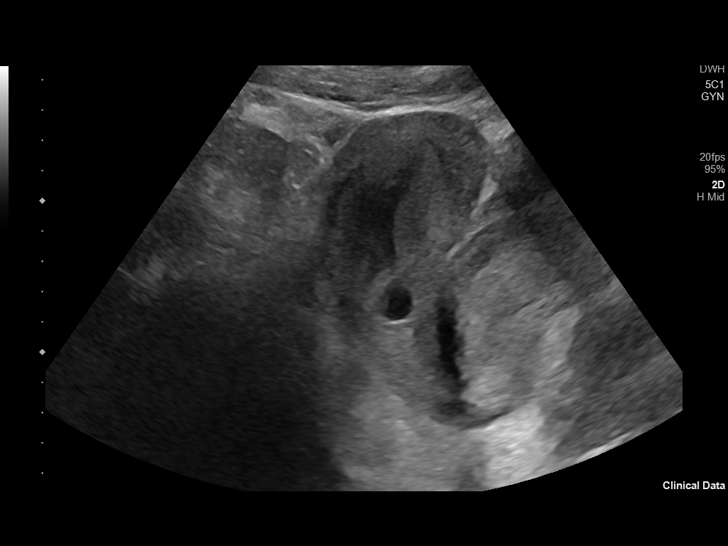
[im 12/101]
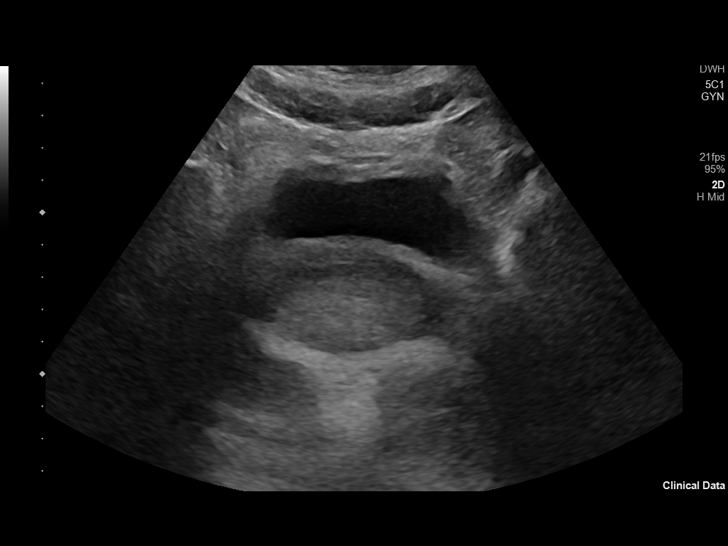
[im 19/101]
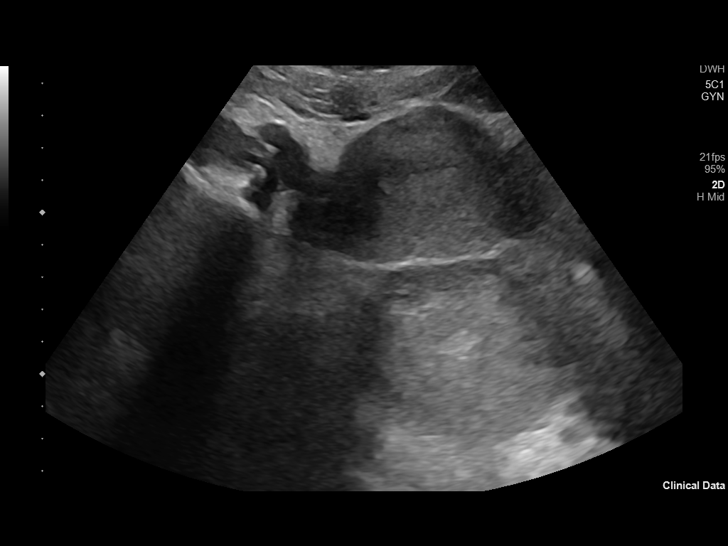
[im 26/101]
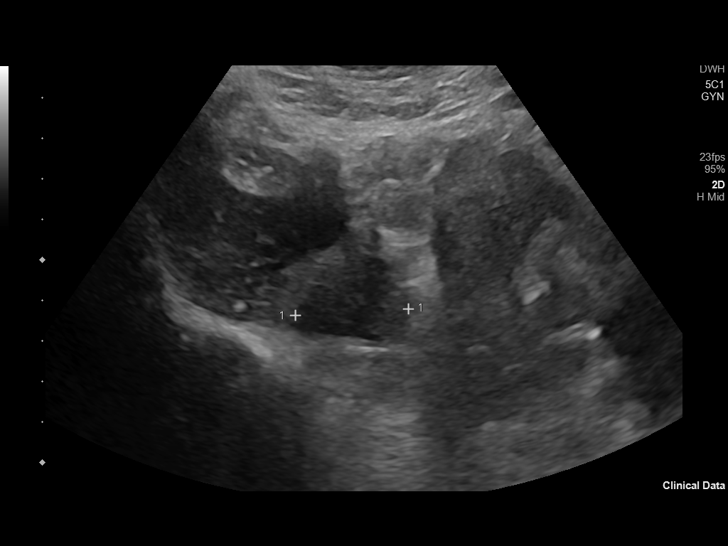
[im 34/101]
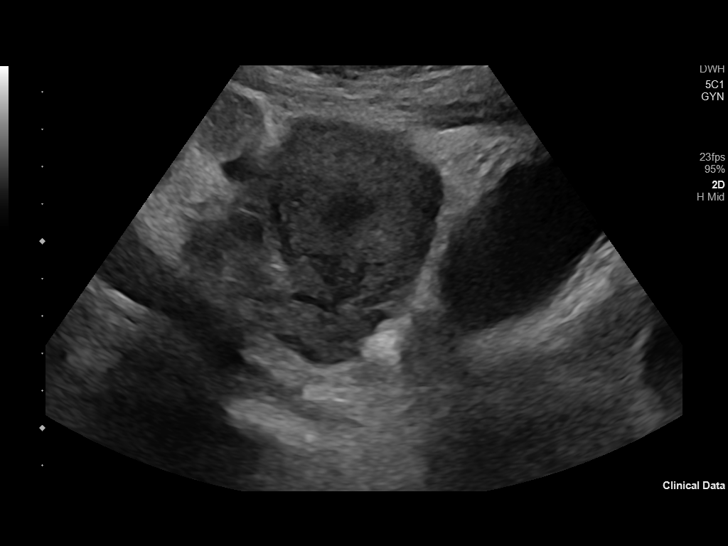
[im 41/101]
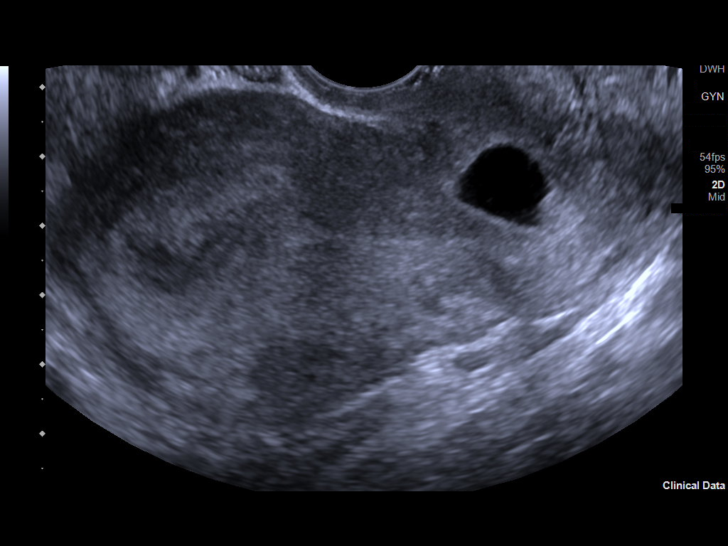
[im 52/101]
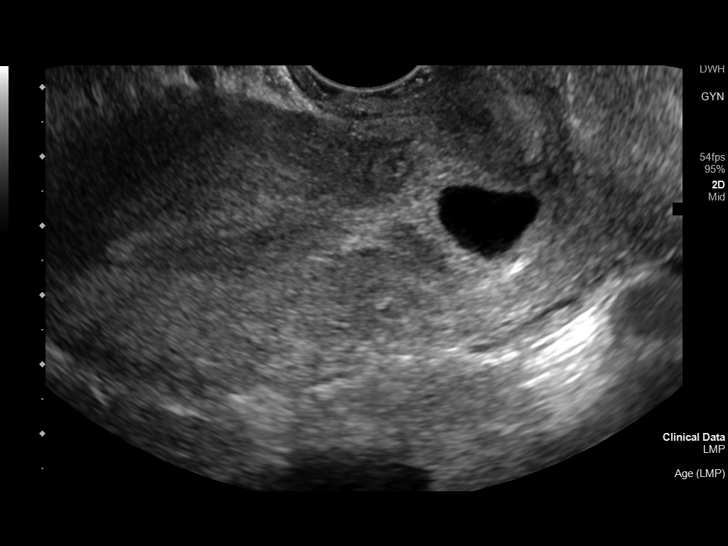
[im 60/101]
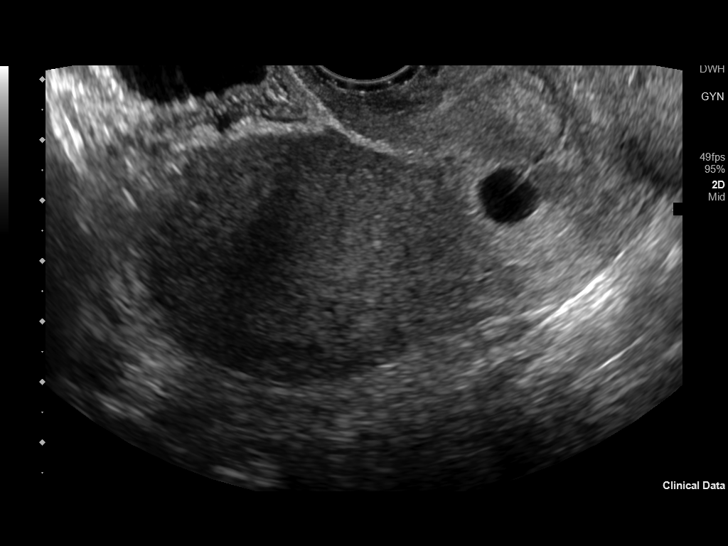
[im 67/101]
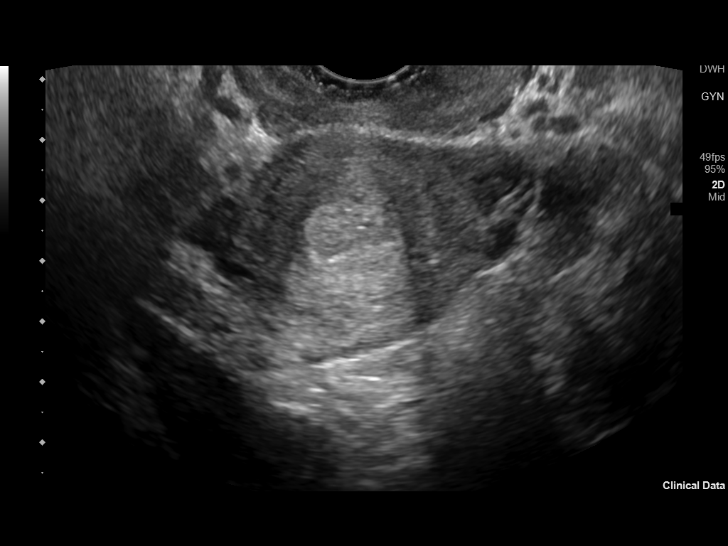
[im 75/101]
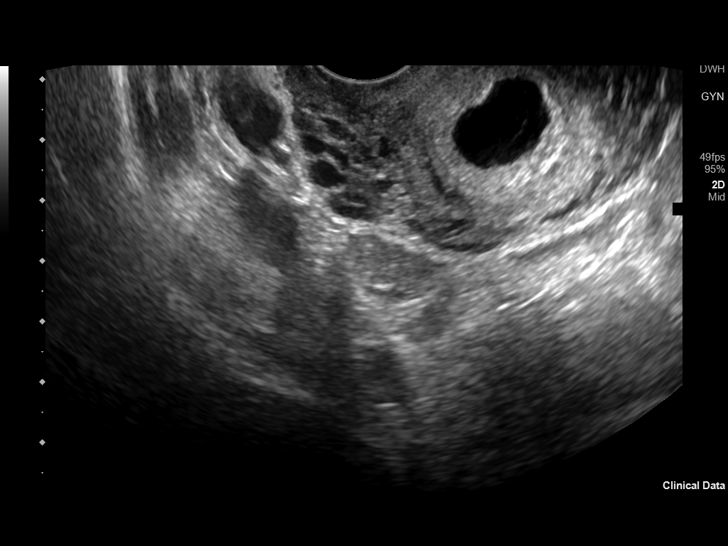
[im 82/101]
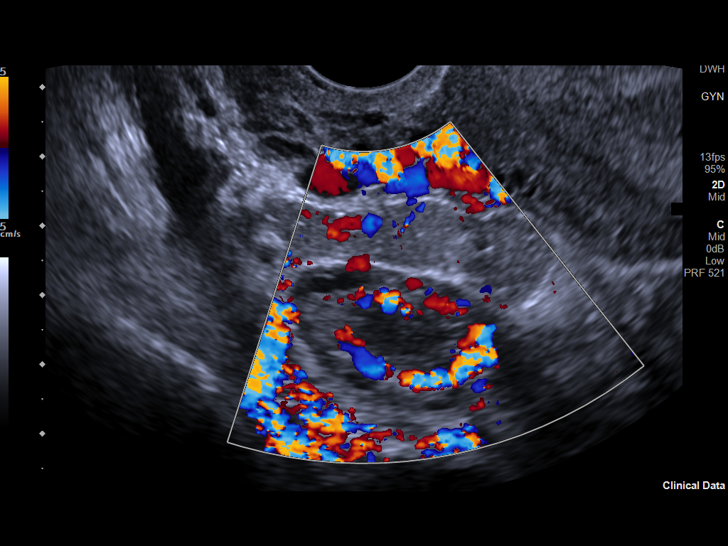
[im 89/101]
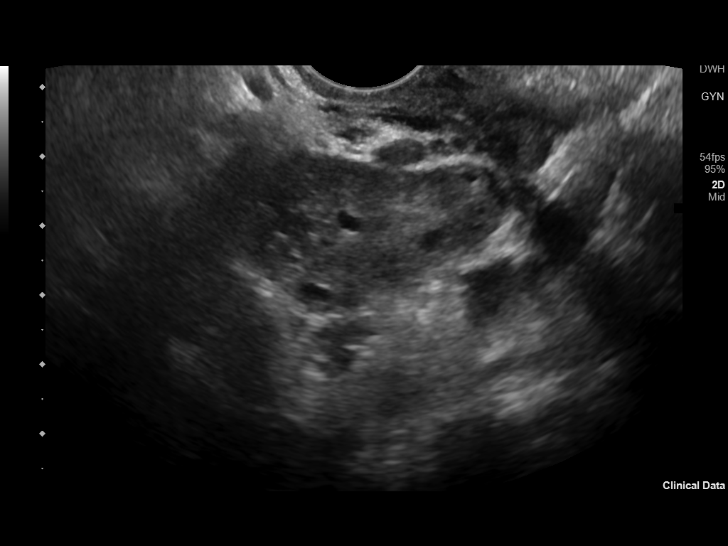
[im 97/101]
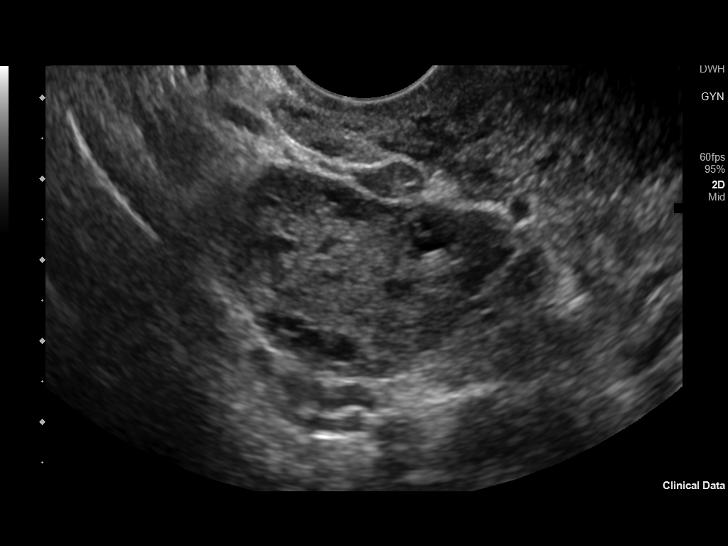

[13 of 28 positions shown; findings below may reference images not displayed]

FINDINGS: Intrauterine gestational sac: A single intrauterine gestational sac
is present and located in the lower uterine segment.

Yolk sac:  Not Visualized.

Embryo:  Not Visualized.

Cardiac Activity: Not Visualized.

MSD: 16 mm   6 w   3 d

Subchorionic hemorrhage:  None visualized.

Maternal uterus/adnexae: Uterus is anteverted. Both ovaries are
visualized. Corpus luteal cyst on the right. No abnormal adnexal
masses. No free pelvic fluid.
IMPRESSION: A single intrauterine gestational sac is present, located in the
lower uterine segment. No fetal pole, yolk sac, or fetal cardiac
activity are identified. Changes likely to represent spontaneous
loss of pregnancy in progress, possibly related to blighted ovum.

## 2023-06-05 ENCOUNTER — Ambulatory Visit
Admission: EM | Admit: 2023-06-05 | Discharge: 2023-06-05 | Disposition: A | Discharge status: Home / Self Care | Payer: Medicaid Other | Attending: Physician Assistant | Admitting: Physician Assistant

## 2023-06-05 ENCOUNTER — Encounter: Payer: Self-pay | Admitting: Emergency Medicine

## 2023-06-05 DIAGNOSIS — R102 Pelvic and perineal pain: Secondary | ICD-10-CM | POA: Diagnosis present

## 2023-06-05 DIAGNOSIS — B9689 Other specified bacterial agents as the cause of diseases classified elsewhere: Secondary | ICD-10-CM | POA: Insufficient documentation

## 2023-06-05 DIAGNOSIS — N96 Recurrent pregnancy loss: Secondary | ICD-10-CM

## 2023-06-05 DIAGNOSIS — N760 Acute vaginitis: Secondary | ICD-10-CM | POA: Diagnosis present

## 2023-06-05 DIAGNOSIS — Z3202 Encounter for pregnancy test, result negative: Secondary | ICD-10-CM | POA: Diagnosis present

## 2023-06-05 LAB — WET PREP, GENITAL
Sperm: NONE SEEN
Trich, Wet Prep: NONE SEEN
WBC, Wet Prep HPF POC: <10 (ref <10)
Yeast Wet Prep HPF POC: NONE SEEN

## 2023-06-05 LAB — CBC WITH DIFFERENTIAL/PLATELET
Abs Immature Granulocytes: 0.01 10*3/uL (ref 0.00–0.07)
Basophils Absolute: 0 10*3/uL (ref 0.0–0.1)
Basophils Relative: 1 %
Eosinophils Absolute: 0.1 10*3/uL (ref 0.0–0.5)
Eosinophils Relative: 3 %
HCT: 39 % (ref 36.0–46.0)
Hemoglobin: 13.3 g/dL (ref 12.0–15.0)
Immature Granulocytes: 0 %
Lymphocytes Relative: 50 %
Lymphs Abs: 1.6 10*3/uL (ref 0.7–4.0)
MCH: 30 pg (ref 26.0–34.0)
MCHC: 34.1 g/dL (ref 30.0–36.0)
MCV: 87.8 fL (ref 80.0–100.0)
Monocytes Absolute: 0.3 10*3/uL (ref 0.1–1.0)
Monocytes Relative: 8 %
Neutro Abs: 1.2 10*3/uL — ABNORMAL LOW (ref 1.7–7.7)
Neutrophils Relative %: 38 %
Platelets: 266 10*3/uL (ref 150–400)
RBC: 4.44 MIL/uL (ref 3.87–5.11)
RDW: 12 % (ref 11.5–15.5)
WBC: 3.1 10*3/uL — ABNORMAL LOW (ref 4.0–10.5)
nRBC: 0 % (ref 0.0–0.2)

## 2023-06-05 LAB — COMPREHENSIVE METABOLIC PANEL
ALT: 15 U/L (ref 0–44)
AST: 23 U/L (ref 15–41)
Albumin: 4.3 g/dL (ref 3.5–5.0)
Alkaline Phosphatase: 36 U/L — ABNORMAL LOW (ref 38–126)
Anion gap: 9 (ref 5–15)
BUN: 14 mg/dL (ref 6–20)
CO2: 27 mmol/L (ref 22–32)
Calcium: 8.8 mg/dL — ABNORMAL LOW (ref 8.9–10.3)
Chloride: 100 mmol/L (ref 98–111)
Creatinine, Ser: 0.73 mg/dL (ref 0.44–1.00)
GFR, Estimated: >60 mL/min (ref >60)
Glucose, Bld: 105 mg/dL — ABNORMAL HIGH (ref 70–99)
Potassium: 3.5 mmol/L (ref 3.5–5.1)
Sodium: 136 mmol/L (ref 135–145)
Total Bilirubin: 0.5 mg/dL (ref 0.3–1.2)
Total Protein: 7.7 g/dL (ref 6.5–8.1)

## 2023-06-05 LAB — TSH: TSH: 0.573 u[IU]/mL (ref 0.350–4.500)

## 2023-06-05 LAB — HCG, QUANTITATIVE, PREGNANCY: hCG, Beta Chain, Quant, S: 1 m[IU]/mL (ref <5)

## 2023-06-05 LAB — PREGNANCY, URINE: Preg Test, Ur: NEGATIVE

## 2023-06-05 MED ORDER — METRONIDAZOLE 500 MG PO TABS: 500.0000 mg | ORAL_TABLET | Freq: Two times a day (BID) | ORAL | 0 refills | Status: AC

## 2023-06-05 NOTE — ED Provider Notes (Signed)
[facility]    CSN: [id number] Arrival date & time: 06/05/23  0805      History   Chief Complaint Chief Complaint  Patient presents with   Possible Pregnancy    HPI Jillian Tucker is a 22 y.o. female presenting for pregnancy testing.  She states that she has had some breast tenderness, pelvic cramping and her last menstrual period was about a month ago.  States she took a pregnancy test on 6/2 and reports that it was positive.  She took 1 yesterday and says it was negative.  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  She says she is never had a workup for why she has been having these miscarriages despite asking for 1.  She does report known precancerous cells on her cervix is currently receiving treatment for that.  At this time she is not complaining of any weakness, extreme fatigue, abdominal/pelvic or back pain.  No urinary symptoms.  No other complaints.  Patient does follow-up with OB/GYN, but fears that she is not really being listened to.  She would like workup as to why she is having the miscarriages.  She also request STI testing today.  HPI  Past Medical History:  Diagnosis Date   Hypothyroidism    low, but stable. no meds    There are no problems to display for this patient.   History reviewed. No pertinent surgical history.  OB History     Gravida  2   Para  0   Term  0   Preterm  0   AB  0   Living  0      SAB  0   IAB  0   Ectopic  0   Multiple  0   Live Births  0            Home Medications    Prior to Admission medications   Medication Sig Start Date End Date Taking? Authorizing Provider  metroNIDAZOLE (FLAGYL) 500 MG tablet Take 1 tablet (500 mg total) by mouth 2 (two) times daily for 7 days. 06/05/23 06/12/23 Yes Eusebio Friendly B, PA-C  Doxylamine-Pyridoxine 10-10 MG TBEC Take 2 tablets by mouth at bedtime. Take 2 tablets at bedtime on days 1 and 2.  If nausea persists take 1 tablet in the morning on day 3 and 2  tablets at bedtime.  If symptoms still continue you may take 1 tablet in the morning, 1 tablet mid afternoon, and 2 tablets at bedtime on day 4. 01/07/22   Becky Augusta, NP  ondansetron (ZOFRAN-ODT) 8 MG disintegrating tablet Take 1 tablet (8 mg total) by mouth every 8 (eight) hours as needed for nausea or vomiting. 10/10/21   Lurline Idol, FNP  Prenatal Vit-Fe Fumarate-FA (PRENATAL COMPLETE) 14-0.4 MG TABS Take 1 tablet by mouth daily with breakfast. 07/04/20   Lurline Idol, FNP    Family History Family History  Problem Relation Age of Onset   Healthy Mother    Healthy Father    Healthy Sister     Social History Social History   Tobacco Use   Smoking status: Never   Smokeless tobacco: Never  Vaping Use   Vaping Use: Never used  Substance Use Topics   Alcohol use: Never   Drug use: Not Currently    Types: Marijuana    Comment: last used May 2021     Allergies   Patient has no known allergies.   Review of Systems Review of Systems  Constitutional:  Negative for fatigue.  Gastrointestinal:  Negative for abdominal pain, nausea and vomiting.  Genitourinary:  Positive for menstrual problem. Negative for difficulty urinating, dysuria, flank pain, frequency, pelvic pain, vaginal bleeding, vaginal discharge and vaginal pain.  Neurological:  Negative for weakness.     Physical Exam Triage Vital Signs ED Triage Vitals  Enc Vitals Group     BP 06/05/23 0817 113/74     Pulse Rate 06/05/23 0817 70     Resp 06/05/23 0817 14     Temp 06/05/23 0817 98.2 F (36.8 C)     Temp Source 06/05/23 0817 Oral     SpO2 06/05/23 0817 96 %     Weight 06/05/23 0816 179 lb 14.3 oz (81.6 kg)     Height 06/05/23 0816 5\' 4"  (1.626 m)     Head Circumference --      Peak Flow --      Pain Score 06/05/23 0815 0     Pain Loc --      Pain Edu? --      Excl. in [place]? --    No data found.  Updated Vital Signs BP 113/74 (BP Location: Right Arm)   Pulse 70   Temp 98.2 F (36.8 C) (Oral)    Resp 14   Ht 5\' 4"  (1.626 m)   Wt 179 lb 14.3 oz (81.6 kg)   LMP 04/30/2023 (Approximate)   SpO2 96%   Breastfeeding No   BMI 30.88 kg/m      Physical Exam Vitals and nursing note reviewed.  Constitutional:      General: She is not in acute distress.    Appearance: Normal appearance. She is not ill-appearing or toxic-appearing.  HENT:     Head: Normocephalic and atraumatic.  Eyes:     General: No scleral icterus.       Right eye: No discharge.        Left eye: No discharge.     Conjunctiva/sclera: Conjunctivae normal.  Cardiovascular:     Rate and Rhythm: Normal rate and regular rhythm.     Heart sounds: Normal heart sounds.  Pulmonary:     Effort: Pulmonary effort is normal. No respiratory distress.     Breath sounds: Normal breath sounds.  Abdominal:     Palpations: Abdomen is soft.     Tenderness: There is no abdominal tenderness.  Musculoskeletal:     Cervical back: Neck supple.  Skin:    General: Skin is dry.  Neurological:     General: No focal deficit present.     Mental Status: She is alert. Mental status is at baseline.     Motor: No weakness.     Gait: Gait normal.  Psychiatric:        Mood and Affect: Mood normal.        Behavior: Behavior normal.        Thought Content: Thought content normal.      UC Treatments / Results  Labs (all labs ordered are listed, but only abnormal results are displayed) Labs Reviewed  WET PREP, GENITAL - Abnormal; Notable for the following components:      Result Value   Clue Cells Wet Prep HPF POC PRESENT (*)    All other components within normal limits  PREGNANCY, URINE  COMPREHENSIVE METABOLIC PANEL  CBC WITH DIFFERENTIAL/PLATELET  HCG, QUANTITATIVE, PREGNANCY  TSH  CERVICOVAGINAL ANCILLARY ONLY    EKG   Radiology No results found.  Procedures Procedures (including critical care time)  Medications Ordered in UC Medications - No data to display  Initial Impression / Assessment and Plan / UC Course   I have reviewed the triage vital signs and the nursing notes.  Pertinent labs & imaging results that were available during my care of the patient were reviewed by me and considered in my medical decision making (see chart for details).   22 year old female with history of multiple miscarriages presents for urine pregnancy test.  States she took a pregnancy test 5 days ago and was positive.  Has had a negative pregnancy test since.  Her last menstrual period was about 4 to 5 weeks ago. Reports a history of 3 previous miscarriages.  Desires workup for miscarriages.  Vitals are normal and stable and she is overall well-appearing.  Abdomen nontender.  Chest clear auscultation heart regular rate and rhythm.  Urine pregnancy test performed.  Negative.  Reviewed results with patient.  Explained to patient that we do not perform extensive workups for pregnancy loss but I could check basic labs for her.  She reports that she has a history of thyroid problem when she was a child and has not had it checked since.  At this time I offered CBC, CMP, TSH and hCG quantitative.  She is agreeable.  I also advised her that if she feels unhappy at the practice she is currently at for OB/GYN then she should try to find another office.  She says she will do so.  Wet prep positive for clue cells.  Will treat with metronidazole.  Pending GC/chlamydia testing.  Patient does need workup for the recurrent pregnancy loss.  Suspect she has an undiagnosed underlying condition.  Final Clinical Impressions(s) / UC Diagnoses   Final diagnoses:  Pregnancy examination or test, negative result  BV (bacterial vaginosis)  Pelvic pain  History of multiple miscarriages     Discharge Instructions      -The urine pregnancy test was negative today but we are obtaining a blood sample to confirm night.  I also drew a couple other labs to check blood counts, metabolic panel, kidney and liver function, thyroid.  Once those results  return I will contact you and let you know. - You should reach out to a different OB/GYN office if you feel you are not being heard at the office you are currently at.  You do need to have more workup performed to figure out why you are having recurrent miscarriages. - You have BV so I sent metronidazole to the pharmacy.  Pending gonorrhea and Chlamydia testing.     ED Prescriptions     Medication Sig Dispense Auth. Provider   metroNIDAZOLE (FLAGYL) 500 MG tablet Take 1 tablet (500 mg total) by mouth 2 (two) times daily for 7 days. 14 tablet Gareth Morgan      PDMP not reviewed this encounter.   Shirlee Latch, PA-C 06/05/23 [phone]

## 2023-06-05 NOTE — Discharge Instructions (Signed)
-  The urine pregnancy test was negative today but we are obtaining a blood sample to confirm night.  I also drew a couple other labs to check blood counts, metabolic panel, kidney and liver function, thyroid.  Once those results return I will contact you and let you know. - You should reach out to a different OB/GYN office if you feel you are not being heard at the office you are currently at.  You do need to have more workup performed to figure out why you are having recurrent miscarriages. - You have BV so I sent metronidazole to the pharmacy.  Pending gonorrhea and Chlamydia testing.

## 2023-06-05 NOTE — ED Triage Notes (Signed)
Patient states that she had a positive pregnancy test on 05/31/23.  Patient reports breast tenderness.  Patient reports 3 previous miscarriages.  Patient denies any vaginal bleeding.  Patient's last period was at the beginning of May 2024.  Patient states that she does have a a OB/GYN.

## 2023-06-08 LAB — CERVICOVAGINAL ANCILLARY ONLY
Chlamydia: NEGATIVE
Comment: NEGATIVE
Comment: NORMAL
Neisseria Gonorrhea: NEGATIVE
